# Patient Record
Sex: Male | Born: 1940 | Race: White | Hispanic: No | Marital: Married | Smoking: Never smoker
Health system: Southern US, Community
[De-identification: ages and names within clinical notes are randomized; demographics above are authoritative.]

## PROBLEM LIST (undated history)

## (undated) DIAGNOSIS — K219 Gastro-esophageal reflux disease without esophagitis: Secondary | ICD-10-CM

## (undated) DIAGNOSIS — N401 Enlarged prostate with lower urinary tract symptoms: Secondary | ICD-10-CM

## (undated) DIAGNOSIS — Z860100 Personal history of colon polyps, unspecified: Secondary | ICD-10-CM

## (undated) DIAGNOSIS — F32A Depression, unspecified: Secondary | ICD-10-CM

## (undated) DIAGNOSIS — I639 Cerebral infarction, unspecified: Secondary | ICD-10-CM

## (undated) DIAGNOSIS — M1990 Unspecified osteoarthritis, unspecified site: Secondary | ICD-10-CM

## (undated) DIAGNOSIS — T7840XA Allergy, unspecified, initial encounter: Secondary | ICD-10-CM

## (undated) DIAGNOSIS — F329 Major depressive disorder, single episode, unspecified: Secondary | ICD-10-CM

## (undated) DIAGNOSIS — E785 Hyperlipidemia, unspecified: Secondary | ICD-10-CM

## (undated) DIAGNOSIS — H579 Unspecified disorder of eye and adnexa: Secondary | ICD-10-CM

## (undated) DIAGNOSIS — G629 Polyneuropathy, unspecified: Secondary | ICD-10-CM

## (undated) DIAGNOSIS — N138 Other obstructive and reflux uropathy: Secondary | ICD-10-CM

## (undated) DIAGNOSIS — I219 Acute myocardial infarction, unspecified: Secondary | ICD-10-CM

## (undated) DIAGNOSIS — I10 Essential (primary) hypertension: Secondary | ICD-10-CM

## (undated) DIAGNOSIS — Z8719 Personal history of other diseases of the digestive system: Secondary | ICD-10-CM

## (undated) HISTORY — DX: Allergy, unspecified, initial encounter: T78.40XA

## (undated) HISTORY — PX: ESOPHAGOGASTRODUODENOSCOPY: SHX1529

## (undated) HISTORY — PX: CARDIAC CATHETERIZATION: SHX172

## (undated) HISTORY — DX: Other obstructive and reflux uropathy: N13.8

## (undated) HISTORY — PX: CORONARY ARTERY BYPASS GRAFT: SHX141

## (undated) HISTORY — PX: SPINE SURGERY: SHX786

## (undated) HISTORY — PX: COLONOSCOPY: SHX174

## (undated) HISTORY — DX: Hyperlipidemia, unspecified: E78.5

## (undated) HISTORY — DX: Depression, unspecified: F32.A

## (undated) HISTORY — DX: Unspecified osteoarthritis, unspecified site: M19.90

## (undated) HISTORY — PX: TONSILLECTOMY: SUR1361

## (undated) HISTORY — PX: OTHER SURGICAL HISTORY: SHX169

## (undated) HISTORY — DX: Personal history of colonic polyps: Z86.010

## (undated) HISTORY — DX: Benign prostatic hyperplasia with lower urinary tract symptoms: N40.1

## (undated) HISTORY — DX: Personal history of colon polyps, unspecified: Z86.0100

## (undated) HISTORY — DX: Major depressive disorder, single episode, unspecified: F32.9

## (undated) HISTORY — PX: ENUCLEATION: SHX628

## (undated) HISTORY — DX: Gastro-esophageal reflux disease without esophagitis: K21.9

---
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
## 2000-04-18 LAB — HM COLONOSCOPY

## 2010-11-16 ENCOUNTER — Ambulatory Visit: Payer: Self-pay | Admitting: Endocrinology

## 2010-12-13 ENCOUNTER — Encounter: Payer: Self-pay | Admitting: Endocrinology

## 2010-12-13 ENCOUNTER — Ambulatory Visit (INDEPENDENT_AMBULATORY_CARE_PROVIDER_SITE_OTHER): Payer: Medicare Other | Admitting: Endocrinology

## 2010-12-13 DIAGNOSIS — M109 Gout, unspecified: Secondary | ICD-10-CM

## 2010-12-13 DIAGNOSIS — E109 Type 1 diabetes mellitus without complications: Secondary | ICD-10-CM

## 2010-12-13 DIAGNOSIS — E119 Type 2 diabetes mellitus without complications: Secondary | ICD-10-CM

## 2010-12-13 DIAGNOSIS — Z794 Long term (current) use of insulin: Secondary | ICD-10-CM

## 2010-12-13 NOTE — Progress Notes (Signed)
Subjective:    Patient ID: Alexander Stafford, male    DOB: Jan 04, 1941, 70 y.o.   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  he has been on insulin x 8 years.  he takes lantus, prn novolog, and glyburide.  Pt says cbg's are 300's due to being on prednisone for gout, x 2 weeks.  He says sine he has been on the prednisone, he has been requring approx 100 units total of insulin per day.  He says he expects to be off his prednisone in approx 3 days. 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  He says exercise has recently been limited by gout pain.   He had to stop metformin, due to renal insufficiency. He hs 10 years of moderate numbness of the feet, and intermittent assoc pain.   Past Medical History  Diagnosis Date  . Arthritis   . Depression   . Diabetes mellitus   . GERD (gastroesophageal reflux disease)   . Allergy   . BPH (benign prostatic hypertrophy) with urinary obstruction   . History of colon polyps   . Hyperlipidemia   . Gout     Past Surgical History  Procedure Date  . Tonsillectomy 1970's  . Enucleation     right eye removed-occular occulsion  . Thumb surgery 1959    History   Social History  . Marital Status: Married    Spouse Name: N/A    Number of Children: N/A  . Years of Education: N/A   Occupational History  . Minister     Social History Main Topics  . Smoking status: Never Smoker   . Smokeless tobacco: Not on file  . Alcohol Use: Yes     on occasion-wine  . Drug Use: No  . Sexually Active: Not on file   Other Topics Concern  . Not on file   Social History Narrative   Retired    No current outpatient prescriptions on file prior to visit.    No Known Allergies  Family History  Problem Relation Age of Onset  . Arthritis Other     Parent  . Cancer Other     Colon Cancer-Parent  . Heart disease Other       Parent  . Kidney disease Other     parent  . Diabetes Other     Parent    BP 140/86  Pulse 77  Temp(Src) 98.3 F (36.8 C) (Oral)  Ht 5\' 10"  (1.778 m)  Wt 248 lb 9.6 oz (112.764 kg)  BMI 35.67 kg/m2  SpO2 95%  Review of Systems denies blurry vision, headache, chest pain, sob, n/v, urinary frequency, cramps, excessive diaphoresis, memory loss, depression, hypoglycemia, and rhinorrhea.   She has weight gain in the context of taking prednisone.  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   He has easy bruising    Objective:   Physical Exam VS: see vs page GEN: no distress.  obese HEAD: head: no deformity eyes: no periorbital swelling, no proptosis external nose and ears are normal mouth: no lesion seen NECK: supple, thyroid is not enlarged CHEST WALL: no deformity LUNGS: clear to auscultation CV: reg rate and rhythm, no murmur ABD: abdomen is soft, nontender.  no hepatosplenomegaly.  not distended.  no hernia MUSCULOSKELETAL: muscle bulk and strength are grossly normal.  no  obvious joint swelling.  gait is normal and steady EXTEMITIES: no deformity.  no ulcer on the feet.  feet are of normal color and temp.  Trace bilat leg edema.  There is onychomycosis of right great toenail PULSES: dorsalis pedis intact bilat.  no carotid bruit NEURO:  cn 2-12 grossly intact.   readily moves all 4's.  sensation is intact to touch on the feet, but decreased from normal. SKIN:  Normal texture and temperature.  No rash or suspicious lesion is visible.   NODES:  None palpable at the neck PSYCH: alert, oriented x3.  Does not appear anxious nor depressed.     Assessment & Plan:  Dm, with several chronic complications.  he is due for some adjustment in his therapy Gout.  Prednisone exacerbates hyperglycemia. Renal insuff.  This precludes metformin rx.

## 2010-12-13 NOTE — Patient Instructions (Addendum)
good diet and exercise habits significanly improve the control of your diabetes.  please let me know if you wish to be referred to a dietician.  high blood sugar is very risky to your health.  you should see an eye doctor every year. controlling your blood pressure and cholesterol drastically reduces the damage diabetes does to your body.  this also applies to quitting smoking.  please discuss these with your doctor.  you should take an aspirin every day, unless you have been advised by a doctor not to. check your blood sugar 2 times a day.  vary the time of day when you check, between before the 3 meals, and at bedtime.  also check if you have symptoms of your blood sugar being too high or too low.  please keep a record of the readings and bring it to your next appointment here.  please call us sooner if you are having low blood sugar episodes. we will need to take this complex situation in stages. For now, stop glyburide. Increase lantus to 70 units daily. Continue novolog as needed.   Please make a follow-up appointment in 2 weeks.

## 2010-12-14 ENCOUNTER — Encounter: Payer: Self-pay | Admitting: Endocrinology

## 2010-12-14 DIAGNOSIS — F329 Major depressive disorder, single episode, unspecified: Secondary | ICD-10-CM | POA: Insufficient documentation

## 2010-12-14 DIAGNOSIS — M1990 Unspecified osteoarthritis, unspecified site: Secondary | ICD-10-CM | POA: Insufficient documentation

## 2010-12-14 DIAGNOSIS — F32A Depression, unspecified: Secondary | ICD-10-CM | POA: Insufficient documentation

## 2010-12-14 DIAGNOSIS — E785 Hyperlipidemia, unspecified: Secondary | ICD-10-CM | POA: Insufficient documentation

## 2010-12-14 DIAGNOSIS — E109 Type 1 diabetes mellitus without complications: Secondary | ICD-10-CM | POA: Insufficient documentation

## 2010-12-14 DIAGNOSIS — E119 Type 2 diabetes mellitus without complications: Secondary | ICD-10-CM | POA: Insufficient documentation

## 2010-12-14 DIAGNOSIS — T7840XA Allergy, unspecified, initial encounter: Secondary | ICD-10-CM | POA: Insufficient documentation

## 2010-12-14 DIAGNOSIS — M109 Gout, unspecified: Secondary | ICD-10-CM | POA: Insufficient documentation

## 2010-12-14 DIAGNOSIS — K219 Gastro-esophageal reflux disease without esophagitis: Secondary | ICD-10-CM | POA: Insufficient documentation

## 2011-01-04 ENCOUNTER — Ambulatory Visit (INDEPENDENT_AMBULATORY_CARE_PROVIDER_SITE_OTHER): Payer: Medicare Other | Admitting: Endocrinology

## 2011-01-04 ENCOUNTER — Encounter: Payer: Self-pay | Admitting: Endocrinology

## 2011-01-04 VITALS — BP 132/88 | HR 66 | Temp 98.1°F | Ht 70.0 in | Wt 248.0 lb

## 2011-01-04 DIAGNOSIS — E119 Type 2 diabetes mellitus without complications: Secondary | ICD-10-CM

## 2011-01-04 NOTE — Progress Notes (Signed)
  Subjective:    Patient ID: Alexander Stafford, male    DOB: 11/07/1940, 70 y.o.   MRN: [id number]  HPI pt states he feels well in general.  He says cbg's are much better, off the prednisone.  no cbg record, but states cbg's vary from 150-300.  It is in general higher as the day goes on.   Past Medical History  Diagnosis Date  . BPH (benign prostatic hypertrophy) with urinary obstruction   . History of colon polyps   . Gout   . Allergy   . Arthritis   . Depression   . Diabetes mellitus   . GERD (gastroesophageal reflux disease)   . Hyperlipidemia     Past Surgical History  Procedure Date  . Tonsillectomy 1970's  . Enucleation     right eye removed-occular occulsion  . Thumb surgery 1959    History   Social History  . Marital Status: Married    Spouse Name: N/A    Number of Children: N/A  . Years of Education: N/A   Occupational History  . Minister     Social History Main Topics  . Smoking status: Never Smoker   . Smokeless tobacco: Not on file  . Alcohol Use: Yes     on occasion-wine  . Drug Use: No  . Sexually Active: Not on file   Other Topics Concern  . Not on file   Social History Narrative   Retired    Current Outpatient Prescriptions on File Prior to Visit  Medication Sig Dispense Refill  . allopurinol (ZYLOPRIM) 300 MG tablet Take 300 mg by mouth daily.        Marland Kitchen aspirin 81 MG tablet Take 81 mg by mouth daily.        . colchicine 0.6 MG tablet Take 0.6 mg by mouth as needed.        . doxazosin (CARDURA) 2 MG tablet Take 2 mg by mouth at bedtime.        . indomethacin (INDOCIN) 50 MG capsule Take 50 mg by mouth as needed.        . insulin aspart (NOVOLOG) 100 UNIT/ML injection On sliding scale       . insulin glargine (LANTUS) 100 UNIT/ML injection Inject 70 Units into the skin at bedtime.       . predniSONE (DELTASONE) 20 MG tablet Take 20 mg by mouth as directed.          No Known Allergies  Family History  Problem Relation Age of Onset  . Arthritis  Other     Parent  . Cancer Other     Colon Cancer-Parent  . Heart disease Other     Parent  . Kidney disease Other     parent  . Diabetes Other     Parent   There were no vitals taken for this visit.  Review of Systems denies hypoglycemia    Objective:   Physical Exam VITAL SIGNS:  See vs page GENERAL: no distress PSYCH: Alert and oriented x 3.  Does not appear anxious nor depressed.    Assessment & Plan:  Dm, needs increased rx

## 2011-01-04 NOTE — Patient Instructions (Signed)
Reduce lantus to 50 units at bedtime Take scheduled novolog, 15 units 3x a day (just before each meal) Please come back for a follow-up appointment for 1 month.  Please make an appointment. This ov was conducted during a computer outage, so pt did not receive an avs.

## 2011-02-07 ENCOUNTER — Ambulatory Visit: Payer: Medicare Other | Admitting: Endocrinology

## 2013-04-18 DIAGNOSIS — I219 Acute myocardial infarction, unspecified: Secondary | ICD-10-CM

## 2013-04-18 HISTORY — DX: Acute myocardial infarction, unspecified: I21.9

## 2016-03-24 ENCOUNTER — Ambulatory Visit (INDEPENDENT_AMBULATORY_CARE_PROVIDER_SITE_OTHER): Payer: Medicare Other | Admitting: Otolaryngology

## 2016-03-24 DIAGNOSIS — J342 Deviated nasal septum: Secondary | ICD-10-CM

## 2016-03-24 DIAGNOSIS — H6982 Other specified disorders of Eustachian tube, left ear: Secondary | ICD-10-CM

## 2016-03-24 DIAGNOSIS — H6123 Impacted cerumen, bilateral: Secondary | ICD-10-CM | POA: Diagnosis not present

## 2016-03-24 DIAGNOSIS — J343 Hypertrophy of nasal turbinates: Secondary | ICD-10-CM | POA: Diagnosis not present

## 2016-03-24 DIAGNOSIS — J310 Chronic rhinitis: Secondary | ICD-10-CM

## 2016-03-24 DIAGNOSIS — H903 Sensorineural hearing loss, bilateral: Secondary | ICD-10-CM

## 2016-03-25 ENCOUNTER — Other Ambulatory Visit (INDEPENDENT_AMBULATORY_CARE_PROVIDER_SITE_OTHER): Payer: Self-pay | Admitting: Otolaryngology

## 2016-03-25 DIAGNOSIS — J328 Other chronic sinusitis: Secondary | ICD-10-CM

## 2016-04-01 ENCOUNTER — Ambulatory Visit (HOSPITAL_COMMUNITY): Payer: Medicare Other

## 2016-04-12 ENCOUNTER — Ambulatory Visit (HOSPITAL_COMMUNITY)
Admission: RE | Admit: 2016-04-12 | Discharge: 2016-04-12 | Disposition: A | Discharge status: Home / Self Care | Payer: Medicare Other | Source: Ambulatory Visit | Attending: Otolaryngology | Admitting: Otolaryngology

## 2016-04-12 DIAGNOSIS — J328 Other chronic sinusitis: Secondary | ICD-10-CM

## 2016-04-28 ENCOUNTER — Ambulatory Visit (INDEPENDENT_AMBULATORY_CARE_PROVIDER_SITE_OTHER): Payer: Medicare Other | Admitting: Otolaryngology

## 2016-04-28 DIAGNOSIS — J342 Deviated nasal septum: Secondary | ICD-10-CM

## 2016-04-28 DIAGNOSIS — J343 Hypertrophy of nasal turbinates: Secondary | ICD-10-CM | POA: Diagnosis not present

## 2016-04-28 DIAGNOSIS — J310 Chronic rhinitis: Secondary | ICD-10-CM | POA: Diagnosis not present

## 2016-05-02 ENCOUNTER — Other Ambulatory Visit: Payer: Self-pay | Admitting: Otolaryngology

## 2016-05-24 ENCOUNTER — Ambulatory Visit (HOSPITAL_BASED_OUTPATIENT_CLINIC_OR_DEPARTMENT_OTHER): Admit: 2016-05-24 | Payer: Medicare Other | Admitting: Otolaryngology

## 2016-05-24 ENCOUNTER — Encounter (HOSPITAL_BASED_OUTPATIENT_CLINIC_OR_DEPARTMENT_OTHER): Payer: Self-pay

## 2016-05-24 SURGERY — SEPTOPLASTY, NOSE, WITH NASAL TURBINATE REDUCTION
Anesthesia: General | Laterality: Bilateral

## 2016-07-11 ENCOUNTER — Ambulatory Visit (INDEPENDENT_AMBULATORY_CARE_PROVIDER_SITE_OTHER): Payer: Medicare Other | Admitting: Otolaryngology

## 2016-07-11 DIAGNOSIS — J342 Deviated nasal septum: Secondary | ICD-10-CM | POA: Diagnosis not present

## 2016-07-11 DIAGNOSIS — J310 Chronic rhinitis: Secondary | ICD-10-CM | POA: Diagnosis not present

## 2016-07-11 DIAGNOSIS — J343 Hypertrophy of nasal turbinates: Secondary | ICD-10-CM

## 2016-08-05 ENCOUNTER — Other Ambulatory Visit: Payer: Self-pay | Admitting: Otolaryngology

## 2016-10-25 ENCOUNTER — Encounter (HOSPITAL_COMMUNITY): Payer: Self-pay

## 2016-10-25 ENCOUNTER — Encounter (HOSPITAL_COMMUNITY)
Admission: RE | Admit: 2016-10-25 | Discharge: 2016-10-25 | Disposition: A | Discharge status: Home / Self Care | Payer: Medicare Other | Source: Ambulatory Visit | Attending: Otolaryngology | Admitting: Otolaryngology

## 2016-10-25 DIAGNOSIS — E785 Hyperlipidemia, unspecified: Secondary | ICD-10-CM | POA: Diagnosis not present

## 2016-10-25 DIAGNOSIS — K219 Gastro-esophageal reflux disease without esophagitis: Secondary | ICD-10-CM | POA: Diagnosis not present

## 2016-10-25 DIAGNOSIS — H5461 Unqualified visual loss, right eye, normal vision left eye: Secondary | ICD-10-CM | POA: Diagnosis not present

## 2016-10-25 DIAGNOSIS — Z79899 Other long term (current) drug therapy: Secondary | ICD-10-CM | POA: Diagnosis not present

## 2016-10-25 DIAGNOSIS — Z01812 Encounter for preprocedural laboratory examination: Secondary | ICD-10-CM | POA: Diagnosis not present

## 2016-10-25 DIAGNOSIS — I10 Essential (primary) hypertension: Secondary | ICD-10-CM | POA: Insufficient documentation

## 2016-10-25 DIAGNOSIS — E119 Type 2 diabetes mellitus without complications: Secondary | ICD-10-CM | POA: Diagnosis not present

## 2016-10-25 DIAGNOSIS — Z7982 Long term (current) use of aspirin: Secondary | ICD-10-CM | POA: Diagnosis not present

## 2016-10-25 DIAGNOSIS — Z794 Long term (current) use of insulin: Secondary | ICD-10-CM | POA: Diagnosis not present

## 2016-10-25 DIAGNOSIS — Z8673 Personal history of transient ischemic attack (TIA), and cerebral infarction without residual deficits: Secondary | ICD-10-CM | POA: Diagnosis not present

## 2016-10-25 DIAGNOSIS — I2510 Atherosclerotic heart disease of native coronary artery without angina pectoris: Secondary | ICD-10-CM | POA: Insufficient documentation

## 2016-10-25 HISTORY — DX: Polyneuropathy, unspecified: G62.9

## 2016-10-25 HISTORY — DX: Personal history of other diseases of the digestive system: Z87.19

## 2016-10-25 HISTORY — DX: Unspecified disorder of eye and adnexa: H57.9

## 2016-10-25 HISTORY — DX: Cerebral infarction, unspecified: I63.9

## 2016-10-25 HISTORY — DX: Acute myocardial infarction, unspecified: I21.9

## 2016-10-25 HISTORY — DX: Essential (primary) hypertension: I10

## 2016-10-25 LAB — COMPREHENSIVE METABOLIC PANEL
ALBUMIN: 3.7 g/dL (ref 3.5–5.0)
ALT: 17 U/L (ref 17–63)
AST: 25 U/L (ref 15–41)
Alkaline Phosphatase: 68 U/L (ref 38–126)
Anion gap: 9 (ref 5–15)
BUN: 23 mg/dL — ABNORMAL HIGH (ref 6–20)
CHLORIDE: 105 mmol/L (ref 101–111)
CO2: 22 mmol/L (ref 22–32)
Calcium: 9 mg/dL (ref 8.9–10.3)
Creatinine, Ser: 1.41 mg/dL — ABNORMAL HIGH (ref 0.61–1.24)
GFR calc Af Amer: 55 mL/min — ABNORMAL LOW (ref >60)
GFR calc non Af Amer: 47 mL/min — ABNORMAL LOW (ref >60)
GLUCOSE: 232 mg/dL — AB (ref 65–99)
POTASSIUM: 4 mmol/L (ref 3.5–5.1)
SODIUM: 136 mmol/L (ref 135–145)
Total Bilirubin: 0.7 mg/dL (ref 0.3–1.2)
Total Protein: 6.2 g/dL — ABNORMAL LOW (ref 6.5–8.1)

## 2016-10-25 LAB — CBC
HEMATOCRIT: 42.4 % (ref 39.0–52.0)
Hemoglobin: 14.1 g/dL (ref 13.0–17.0)
MCH: 30.4 pg (ref 26.0–34.0)
MCHC: 33.3 g/dL (ref 30.0–36.0)
MCV: 91.4 fL (ref 78.0–100.0)
Platelets: 207 10*3/uL (ref 150–400)
RBC: 4.64 MIL/uL (ref 4.22–5.81)
RDW: 14 % (ref 11.5–15.5)
WBC: 6.4 10*3/uL (ref 4.0–10.5)

## 2016-10-25 LAB — GLUCOSE, CAPILLARY: GLUCOSE-CAPILLARY: 242 mg/dL — AB (ref 65–99)

## 2016-10-25 LAB — NO BLOOD PRODUCTS

## 2016-10-25 NOTE — Pre-Procedure Instructions (Signed)
Alexander Stafford  10/25/2016      [address], [address] [place] [address] Phone: [phone] Fax: [place], [place] [address] [place] [address] Phone: [phone] Fax: [phone]    Your procedure is scheduled on July 18  Report to [facility] at [place].M.  Call this number if you have problems the morning of surgery:  [phone]   Remember:  Do not eat food or drink liquids after midnight.   Take these medicines the morning of surgery with A SIP OF WATER allopurinol (ZYLOPRIM), carvedilol (COREG), colchicine,  indomethacin (INDOCIN),   7 days prior to surgery STOP taking any meloxicam (MOBIC), Aspirin, Aleve, Naproxen, Ibuprofen, Motrin, Advil, Goody's, BC's, all herbal medications, fish oil, and all vitamins  WHAT DO I DO ABOUT MY DIABETES MEDICATION?   Marland Kitchen Do not take oral diabetes medicines (pills) the morning of surgery.   The day before surgery take insulin lispro as usual        The Night before surgery, take 25 units of TRESIBA insulin  . HE MORNING OF SURGERY, take ______2_______ units of ___insulin lispro_______insulin if needed for a blood sugar over 150.  Marland Kitchen The day of surgery, do not take other diabetes injectables, including Byetta (exenatide), Bydureon (exenatide ER), Victoza (liraglutide), or Trulicity (dulaglutide).  . If your CBG is greater than 220 mg/dL, you may take  of your sliding scale (correction) dose of insulin.   How to Manage Your Diabetes Before and After Surgery  Why is it important to control my blood sugar before and after surgery? . Improving blood sugar levels before and after surgery helps healing and can limit problems. . A way of improving blood sugar control is eating a healthy diet by: o  Eating less sugar and carbohydrates o  Increasing activity/exercise o   Talking with your doctor about reaching your blood sugar goals . High blood sugars (greater than 180 mg/dL) can raise your risk of infections and slow your recovery, so you will need to focus on controlling your diabetes during the weeks before surgery. . Make sure that the doctor who takes care of your diabetes knows about your planned surgery including the date and location.  How do I manage my blood sugar before surgery? . Check your blood sugar at least 4 times a day, starting 2 days before surgery, to make sure that the level is not too high or low. o Check your blood sugar the morning of your surgery when you wake up and every 2 hours until you get to the Short Stay unit. . If your blood sugar is less than 70 mg/dL, you will need to treat for low blood sugar: o Do not take insulin. o Treat a low blood sugar (less than 70 mg/dL) with  cup of clear juice (cranberry or apple), 4 glucose tablets, OR glucose gel. o Recheck blood sugar in 15 minutes after treatment (to make sure it is greater than 70 mg/dL). If your blood sugar is not greater than 70 mg/dL on recheck, call [phone] for further instructions. . Report your blood sugar to the short stay nurse when you get to Short Stay.  . If you are admitted to the hospital after surgery: o Your blood sugar will be checked by the staff and you will probably be given insulin  after surgery (instead of oral diabetes medicines) to make sure you have good blood sugar levels. o The goal for blood sugar control after surgery is 80-180 mg/dL.    Do not wear jewelry  Do not wear lotions, powders, or cologne, or deoderant.  Men may shave face and neck.  Do not bring valuables to the hospital.  [facility] is not responsible for any belongings or valuables.  Contacts, dentures or bridgework may not be worn into surgery.  Leave your suitcase in the car.  After surgery it may be brought to your room.  For patients admitted to the hospital, discharge  time will be determined by your treatment team.  Patients discharged the day of surgery will not be allowed to drive home.    Special instructions:   [facility]- Preparing For Surgery  Before surgery, you can play an important role. Because skin is not sterile, your skin needs to be as free of germs as possible. You can reduce the number of germs on your skin by washing with CHG (chlorahexidine gluconate) Soap before surgery.  CHG is an antiseptic cleaner which kills germs and bonds with the skin to continue killing germs even after washing.  Please do not use if you have an allergy to CHG or antibacterial soaps. If your skin becomes reddened/irritated stop using the CHG.  Do not shave (including legs and underarms) for at least 48 hours prior to first CHG shower. It is OK to shave your face.  Please follow these instructions carefully.   1. Shower the NIGHT BEFORE SURGERY and the MORNING OF SURGERY with CHG.   2. If you chose to wash your hair, wash your hair first as usual with your normal shampoo.  3. After you shampoo, rinse your hair and body thoroughly to remove the shampoo.  4. Use CHG as you would any other liquid soap. You can apply CHG directly to the skin and wash gently with a scrungie or a clean washcloth.   5. Apply the CHG Soap to your body ONLY FROM THE NECK DOWN.  Do not use on open wounds or open sores. Avoid contact with your eyes, ears, mouth and genitals (private parts). Wash genitals (private parts) with your normal soap.  6. Wash thoroughly, paying special attention to the area where your surgery will be performed.  7. Thoroughly rinse your body with warm water from the neck down.  8. DO NOT shower/wash with your normal soap after using and rinsing off the CHG Soap.  9. Pat yourself dry with a CLEAN TOWEL.   10. Wear CLEAN PAJAMAS   11. Place CLEAN SHEETS on your bed the night of your first shower and DO NOT SLEEP WITH PETS.    Day of Surgery: Do not  apply any deodorants/lotions. Please wear clean clothes to the hospital/surgery center.      Please read over the following fact sheets that you were given.

## 2016-10-25 NOTE — Progress Notes (Signed)
PCP - Vladimir Creeks Cardiologist - Dorie Rank  Chest x-ray - not needed EKG - 06/15/16 requesting from cardiologist Stress Test - > 10 years ECHO - 2015   Cardiac Cath - 2015   Fasting Blood Sugar - 70-120s Checks Blood Sugar __3___ times a day   Will send to anesthesia for review of cardiac records, and records requested  Patient denies shortness of breath, fever, cough and chest pain at PAT appointment   Patient verbalized understanding of instructions that were given to them at the PAT appointment. Patient was also instructed that they will need to review over the PAT instructions again at home before surgery.

## 2016-10-26 NOTE — Progress Notes (Addendum)
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 BMI 35.  Medications include: ASA 81 mg, carvedilol, Humalog, lisinopril, tresiba  BP (!) 179/92   Pulse (!) 54   Temp 36.5 C (Oral)   Resp 20   Ht 5\' 10"  (1.778 m)   Wt 243 lb 6.2 oz (110.4 kg)   SpO2 99%   BMI 34.92 kg/m    Preoperative labs reviewed.  Glucose 232. HbA1c 6.4 on 09/09/16 (care everywhere)  EKG 06/07/16 ([facility]): Sinus bradycardia (56 bpm). RBBB.  Carotid duplex 10/05/15 (care everywhere): 1.No hemodynamically significant stenosis on either side. 2.Both vertebral arteries are patent with antegrade flow.  Stress test 02/09/15 ([facility]):  1. No scintigraphic evidence of inducible myocardial ischemia. Small to moderate sized fixed defect in the lateral wall consistent with prior MI. 2. Post-stress EF 57%. Global LV systolic function is normal. 3. Exercise capacity 7 METS. No ECG changes   Echo 11/20/13 (care everywhere):  - A portable transthoracic echocardiogram with 2-D, color flow, and Doppler was performed. The study was technically difficult. There is no comparison study available. - LV normal in size. Mild concentric LVH.  LV ejection fraction is normal (55-60%). There is inferolateral wall hypokinesis. - The mitral valve leaflets appear calcified, but not stenotic. - Moderate aortic sclerosis is present with good valvular opening. - Right ventricular systolic pressure is normal. - There is mild (1+) mitral regurgitation. - Grade II moderate diastolic dysfunction; pseudonormal mitral inflow pattern.  No cardiac cath since  prior to CABG 11/20/13 (notes in care everywhere)   If no changes, I anticipate pt can proceed with surgery as scheduled.   Willeen Cass, FNP-BC [facility]/Anesthesiology Phone: [phone] 10/28/2016 3:37 PM

## 2016-11-01 NOTE — Anesthesia Preprocedure Evaluation (Addendum)
Anesthesia Evaluation  Patient identified by MRN, date of birth, ID band Patient awake    Reviewed: Allergy & Precautions, H&P , NPO status , Patient's Chart, lab work & pertinent test results  Airway Mallampati: III  TM Distance: >3 FB Neck ROM: Full  Mouth opening: Limited Mouth Opening  Dental no notable dental hx. (+) Teeth Intact, Dental Advisory Given   Pulmonary neg pulmonary ROS,    Pulmonary exam normal breath sounds clear to auscultation       Cardiovascular Exercise Tolerance: Good hypertension, Pt. on medications and Pt. on home beta blockers + Past MI   Rhythm:Regular Rate:Normal     Neuro/Psych Depression CVA negative psych ROS   GI/Hepatic Neg liver ROS, hiatal hernia, GERD  Medicated and Controlled,  Endo/Other  diabetes, Insulin Dependent  Renal/GU negative Renal ROS  negative genitourinary   Musculoskeletal  (+) Arthritis , Osteoarthritis,    Abdominal   Peds  Hematology negative hematology ROS (+)   Anesthesia Other Findings   Reproductive/Obstetrics negative OB ROS                            Anesthesia Physical Anesthesia Plan  ASA: III  Anesthesia Plan: General   Post-op Pain Management:    Induction: Intravenous  PONV Risk Score and Plan: 3 and Ondansetron, Dexamethasone and Midazolam  Airway Management Planned: Oral ETT and Video Laryngoscope Planned  Additional Equipment:   Intra-op Plan:   Post-operative Plan: Extubation in OR  Informed Consent: I have reviewed the patients History and Physical, chart, labs and discussed the procedure including the risks, benefits and alternatives for the proposed anesthesia with the patient or authorized representative who has indicated his/her understanding and acceptance.   Dental advisory given  Plan Discussed with: CRNA  Anesthesia Plan Comments:        Anesthesia Quick Evaluation

## 2016-11-02 ENCOUNTER — Encounter (HOSPITAL_COMMUNITY): Payer: Self-pay

## 2016-11-02 ENCOUNTER — Ambulatory Visit (HOSPITAL_COMMUNITY)
Admission: RE | Admit: 2016-11-02 | Discharge: 2016-11-02 | Disposition: A | Discharge status: Home / Self Care | Payer: Medicare Other | Source: Ambulatory Visit | Attending: Otolaryngology | Admitting: Otolaryngology

## 2016-11-02 ENCOUNTER — Ambulatory Visit (HOSPITAL_COMMUNITY): Payer: Medicare Other | Admitting: Certified Registered Nurse Anesthetist

## 2016-11-02 ENCOUNTER — Encounter (HOSPITAL_COMMUNITY)
Admission: RE | Disposition: A | Discharge status: Home / Self Care | Payer: Self-pay | Source: Ambulatory Visit | Attending: Otolaryngology

## 2016-11-02 ENCOUNTER — Ambulatory Visit (HOSPITAL_COMMUNITY): Payer: Medicare Other | Admitting: Emergency Medicine

## 2016-11-02 DIAGNOSIS — Z951 Presence of aortocoronary bypass graft: Secondary | ICD-10-CM | POA: Diagnosis not present

## 2016-11-02 DIAGNOSIS — Z794 Long term (current) use of insulin: Secondary | ICD-10-CM | POA: Insufficient documentation

## 2016-11-02 DIAGNOSIS — J3489 Other specified disorders of nose and nasal sinuses: Secondary | ICD-10-CM | POA: Diagnosis not present

## 2016-11-02 DIAGNOSIS — I10 Essential (primary) hypertension: Secondary | ICD-10-CM | POA: Diagnosis not present

## 2016-11-02 DIAGNOSIS — K219 Gastro-esophageal reflux disease without esophagitis: Secondary | ICD-10-CM | POA: Diagnosis not present

## 2016-11-02 DIAGNOSIS — E119 Type 2 diabetes mellitus without complications: Secondary | ICD-10-CM | POA: Diagnosis not present

## 2016-11-02 DIAGNOSIS — J343 Hypertrophy of nasal turbinates: Secondary | ICD-10-CM | POA: Insufficient documentation

## 2016-11-02 DIAGNOSIS — I252 Old myocardial infarction: Secondary | ICD-10-CM | POA: Diagnosis not present

## 2016-11-02 DIAGNOSIS — D3705 Neoplasm of uncertain behavior of pharynx: Secondary | ICD-10-CM | POA: Diagnosis not present

## 2016-11-02 DIAGNOSIS — Z8673 Personal history of transient ischemic attack (TIA), and cerebral infarction without residual deficits: Secondary | ICD-10-CM | POA: Insufficient documentation

## 2016-11-02 DIAGNOSIS — J342 Deviated nasal septum: Secondary | ICD-10-CM | POA: Insufficient documentation

## 2016-11-02 DIAGNOSIS — J339 Nasal polyp, unspecified: Secondary | ICD-10-CM | POA: Diagnosis not present

## 2016-11-02 HISTORY — PX: NASOPHARYNGEAL BIOPSY: SHX6488

## 2016-11-02 HISTORY — PX: NASAL SEPTOPLASTY W/ TURBINOPLASTY: SHX2070

## 2016-11-02 LAB — GLUCOSE, CAPILLARY
GLUCOSE-CAPILLARY: 66 mg/dL (ref 65–99)
Glucose-Capillary: 95 mg/dL (ref 65–99)
Glucose-Capillary: 135 mg/dL — ABNORMAL HIGH (ref 65–99)

## 2016-11-02 SURGERY — SEPTOPLASTY, NOSE, WITH NASAL TURBINATE REDUCTION
Anesthesia: General | Site: Nose

## 2016-11-02 MED ORDER — FENTANYL CITRATE (PF) 250 MCG/5ML IJ SOLN
INTRAMUSCULAR | Status: AC
  Filled 2016-11-02: qty 5

## 2016-11-02 MED ORDER — BACITRACIN ZINC 500 UNIT/GM EX OINT
TOPICAL_OINTMENT | CUTANEOUS | Status: AC
  Filled 2016-11-02: qty 28.35

## 2016-11-02 MED ORDER — HYDRALAZINE HCL 20 MG/ML IJ SOLN
5.0000 mg | Freq: Once | INTRAMUSCULAR | Status: AC
  Administered 2016-11-02: 5 mg via INTRAVENOUS

## 2016-11-02 MED ORDER — PHENYLEPHRINE HCL 10 MG/ML IJ SOLN
INTRAVENOUS | Status: DC | PRN
  Administered 2016-11-02: 25 ug/min via INTRAVENOUS

## 2016-11-02 MED ORDER — OXYCODONE-ACETAMINOPHEN 5-325 MG PO TABS
1.0000 | ORAL_TABLET | ORAL | 0 refills | Status: AC | PRN
Start: 2016-11-02 — End: ?

## 2016-11-02 MED ORDER — FENTANYL CITRATE (PF) 100 MCG/2ML IJ SOLN
INTRAMUSCULAR | Status: DC | PRN
  Administered 2016-11-02: 50 ug via INTRAVENOUS
  Administered 2016-11-02 (×2): 25 ug via INTRAVENOUS
  Administered 2016-11-02: 50 ug via INTRAVENOUS

## 2016-11-02 MED ORDER — SUCCINYLCHOLINE CHLORIDE 200 MG/10ML IV SOSY
PREFILLED_SYRINGE | INTRAVENOUS | Status: AC
  Filled 2016-11-02: qty 20

## 2016-11-02 MED ORDER — CARVEDILOL 12.5 MG PO TABS
ORAL_TABLET | ORAL | Status: AC
  Filled 2016-11-02: qty 1

## 2016-11-02 MED ORDER — MIDAZOLAM HCL 5 MG/5ML IJ SOLN: INTRAMUSCULAR | Status: DC | PRN

## 2016-11-02 MED ORDER — HYDRALAZINE HCL 20 MG/ML IJ SOLN
INTRAMUSCULAR | Status: AC
  Filled 2016-11-02: qty 1

## 2016-11-02 MED ORDER — PROPOFOL 10 MG/ML IV BOLUS
INTRAVENOUS | Status: AC
Start: 2016-11-02 — End: 2016-11-02
  Filled 2016-11-02: qty 20

## 2016-11-02 MED ORDER — CEFAZOLIN SODIUM 1 G IJ SOLR
INTRAMUSCULAR | Status: AC
  Filled 2016-11-02: qty 20

## 2016-11-02 MED ORDER — CARVEDILOL 12.5 MG PO TABS
12.5000 mg | ORAL_TABLET | Freq: Two times a day (BID) | ORAL | Status: DC
  Administered 2016-11-02: 12.5 mg via ORAL

## 2016-11-02 MED ORDER — PROPOFOL 10 MG/ML IV BOLUS
INTRAVENOUS | Status: DC | PRN
  Administered 2016-11-02: 100 mg via INTRAVENOUS

## 2016-11-02 MED ORDER — LIDOCAINE-EPINEPHRINE 1 %-1:100000 IJ SOLN
INTRAMUSCULAR | Status: AC
  Filled 2016-11-02: qty 1

## 2016-11-02 MED ORDER — SUGAMMADEX SODIUM 500 MG/5ML IV SOLN
INTRAVENOUS | Status: DC | PRN
  Administered 2016-11-02: 300 mg via INTRAVENOUS

## 2016-11-02 MED ORDER — ROCURONIUM BROMIDE 100 MG/10ML IV SOLN
INTRAVENOUS | Status: DC | PRN
  Administered 2016-11-02: 30 mg via INTRAVENOUS
  Administered 2016-11-02 (×2): 10 mg via INTRAVENOUS

## 2016-11-02 MED ORDER — OXYMETAZOLINE HCL 0.05 % NA SOLN
NASAL | Status: AC
  Filled 2016-11-02: qty 15

## 2016-11-02 MED ORDER — PHENYLEPHRINE 40 MCG/ML (10ML) SYRINGE FOR IV PUSH (FOR BLOOD PRESSURE SUPPORT)
PREFILLED_SYRINGE | INTRAVENOUS | Status: AC
  Filled 2016-11-02: qty 10

## 2016-11-02 MED ORDER — BACITRACIN ZINC 500 UNIT/GM EX OINT
TOPICAL_OINTMENT | CUTANEOUS | Status: DC | PRN
  Administered 2016-11-02: 1 via TOPICAL

## 2016-11-02 MED ORDER — LACTATED RINGERS IV SOLN
INTRAVENOUS | Status: DC | PRN
  Administered 2016-11-02 (×2): via INTRAVENOUS

## 2016-11-02 MED ORDER — LIDOCAINE HCL (CARDIAC) 20 MG/ML IV SOLN
INTRAVENOUS | Status: DC | PRN
  Administered 2016-11-02: 80 mg via INTRAVENOUS

## 2016-11-02 MED ORDER — 0.9 % SODIUM CHLORIDE (POUR BTL) OPTIME
TOPICAL | Status: DC | PRN
  Administered 2016-11-02: 1000 mL

## 2016-11-02 MED ORDER — OXYMETAZOLINE HCL 0.05 % NA SOLN
NASAL | Status: DC | PRN
  Administered 2016-11-02: 1

## 2016-11-02 MED ORDER — DEXTROSE 50 % IV SOLN
INTRAVENOUS | Status: DC
Start: 2016-11-02 — End: 2016-11-02
  Filled 2016-11-02: qty 50

## 2016-11-02 MED ORDER — ONDANSETRON HCL 4 MG/2ML IJ SOLN
INTRAMUSCULAR | Status: AC
  Filled 2016-11-02: qty 2

## 2016-11-02 MED ORDER — LIDOCAINE-EPINEPHRINE 1 %-1:100000 IJ SOLN
INTRAMUSCULAR | Status: DC | PRN
  Administered 2016-11-02: 4.5 mL

## 2016-11-02 MED ORDER — HYDROMORPHONE HCL 1 MG/ML IJ SOLN
0.2500 mg | INTRAMUSCULAR | Status: DC | PRN
  Administered 2016-11-02: 0.5 mg via INTRAVENOUS

## 2016-11-02 MED ORDER — ROCURONIUM BROMIDE 50 MG/5ML IV SOLN
INTRAVENOUS | Status: AC
  Filled 2016-11-02: qty 2

## 2016-11-02 MED ORDER — CEFAZOLIN SODIUM-DEXTROSE 2-3 GM-% IV SOLR
INTRAVENOUS | Status: DC | PRN
  Administered 2016-11-02: 2 g via INTRAVENOUS

## 2016-11-02 MED ORDER — ONDANSETRON HCL 4 MG/2ML IJ SOLN
INTRAMUSCULAR | Status: DC | PRN
  Administered 2016-11-02: 4 mg via INTRAVENOUS

## 2016-11-02 MED ORDER — PROPOFOL 10 MG/ML IV BOLUS
INTRAVENOUS | Status: AC
  Filled 2016-11-02: qty 20

## 2016-11-02 MED ORDER — SUCCINYLCHOLINE CHLORIDE 20 MG/ML IJ SOLN
INTRAMUSCULAR | Status: DC | PRN
  Administered 2016-11-02: 100 mg via INTRAVENOUS

## 2016-11-02 MED ORDER — HYDROMORPHONE HCL 1 MG/ML IJ SOLN
INTRAMUSCULAR | Status: AC
  Filled 2016-11-02: qty 0.5

## 2016-11-02 MED ORDER — SUCCINYLCHOLINE CHLORIDE 200 MG/10ML IV SOSY
PREFILLED_SYRINGE | INTRAVENOUS | Status: AC
  Filled 2016-11-02: qty 10

## 2016-11-02 MED ORDER — DEXAMETHASONE SODIUM PHOSPHATE 10 MG/ML IJ SOLN
INTRAMUSCULAR | Status: DC | PRN
  Administered 2016-11-02: 5 mg via INTRAVENOUS

## 2016-11-02 MED ORDER — EPHEDRINE SULFATE 50 MG/ML IJ SOLN
INTRAMUSCULAR | Status: DC | PRN
  Administered 2016-11-02: 10 mg via INTRAVENOUS

## 2016-11-02 MED ORDER — LIDOCAINE HCL (CARDIAC) 20 MG/ML IV SOLN
INTRAVENOUS | Status: AC
Start: 2016-11-02 — End: 2016-11-02
  Filled 2016-11-02: qty 5

## 2016-11-02 MED ORDER — ROCURONIUM BROMIDE 50 MG/5ML IV SOLN
INTRAVENOUS | Status: AC
  Filled 2016-11-02: qty 1

## 2016-11-02 MED ORDER — DEXTROSE 50 % IV SOLN
25.0000 mL | Freq: Once | INTRAVENOUS | Status: AC
  Administered 2016-11-02: 25 mL via INTRAVENOUS

## 2016-11-02 MED ORDER — SUGAMMADEX SODIUM 500 MG/5ML IV SOLN
INTRAVENOUS | Status: AC
  Filled 2016-11-02: qty 5

## 2016-11-02 MED ORDER — AMOXICILLIN 875 MG PO TABS: 875.0000 mg | ORAL_TABLET | Freq: Two times a day (BID) | ORAL | 0 refills | Status: AC

## 2016-11-02 MED ORDER — EPHEDRINE 5 MG/ML INJ
INTRAVENOUS | Status: AC
  Filled 2016-11-02: qty 20

## 2016-11-02 MED ORDER — GLYCOPYRROLATE 0.2 MG/ML IJ SOLN
INTRAMUSCULAR | Status: DC | PRN
  Administered 2016-11-02 (×2): 0.1 mg via INTRAVENOUS

## 2016-11-02 SURGICAL SUPPLY — 30 items
CANISTER SUCT 3000ML PPV (MISCELLANEOUS) ×3 IMPLANT
COAGULATOR SUCT SWTCH 10FR 6 (ELECTROSURGICAL) ×3 IMPLANT
CONT SPEC 4OZ CLIKSEAL STRL BL (MISCELLANEOUS) ×3 IMPLANT
COVER SURGICAL LIGHT HANDLE (MISCELLANEOUS) ×3 IMPLANT
DRAPE HALF SHEET 40X57 (DRAPES) IMPLANT
ELECT REM PT RETURN 9FT ADLT (ELECTROSURGICAL) ×3
ELECTRODE REM PT RTRN 9FT ADLT (ELECTROSURGICAL) ×2 IMPLANT
GAUZE SPONGE 2X2 8PLY STRL LF (GAUZE/BANDAGES/DRESSINGS) ×2 IMPLANT
GLOVE BIOGEL PI IND STRL 8.5 (GLOVE) ×2 IMPLANT
GLOVE BIOGEL PI INDICATOR 8.5 (GLOVE) ×1
GLOVE ECLIPSE 7.5 STRL STRAW (GLOVE) ×3 IMPLANT
GLOVE ECLIPSE 8.0 STRL XLNG CF (GLOVE) ×3 IMPLANT
GOWN STRL REUS W/ TWL LRG LVL3 (GOWN DISPOSABLE) ×2 IMPLANT
GOWN STRL REUS W/ TWL XL LVL3 (GOWN DISPOSABLE) ×2 IMPLANT
GOWN STRL REUS W/TWL LRG LVL3 (GOWN DISPOSABLE) ×1
GOWN STRL REUS W/TWL XL LVL3 (GOWN DISPOSABLE) ×1
KIT BASIN OR (CUSTOM PROCEDURE TRAY) ×3 IMPLANT
KIT ROOM TURNOVER OR (KITS) ×3 IMPLANT
NEEDLE HYPO 25GX1X1/2 BEV (NEEDLE) ×3 IMPLANT
NS IRRIG 1000ML POUR BTL (IV SOLUTION) ×3 IMPLANT
PAD ARMBOARD 7.5X6 YLW CONV (MISCELLANEOUS) ×3 IMPLANT
SPLINT NASAL DOYLE BI-VL (GAUZE/BANDAGES/DRESSINGS) ×3 IMPLANT
SPONGE GAUZE 2X2 STER 10/PKG (GAUZE/BANDAGES/DRESSINGS) ×1
SPONGE NEURO XRAY DETECT 1X3 (DISPOSABLE) ×3 IMPLANT
SUT CHROMIC 3 0 SH 27 (SUTURE) ×3 IMPLANT
SUT PLAIN 4 0 ~~LOC~~ 1 (SUTURE) ×3 IMPLANT
SUT PROLENE 2 0 FS (SUTURE) ×3 IMPLANT
TRAY ENT MC OR (CUSTOM PROCEDURE TRAY) ×3 IMPLANT
TUBE SALEM SUMP 16 FR W/ARV (TUBING) IMPLANT
TUBING EXTENTION W/L.L. (IV SETS) IMPLANT

## 2016-11-02 NOTE — H&P (Signed)
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  On his recent sinus CT scan, the patient was noted to have a soft tissue density within his left nasopharynx. At his last visit, a soft tissue mass was noted within the left fossa of Rosenmller.  No surface ulceration was noted.  The appearance was thought most likely to be consistent with a benign cyst.  The patient returns today reporting persistent nasal obstruction. He is interested in surgical intervention. He was recently cleared by his cardiologist at [facility]. The patient has a history of two heart attacks and coronary bypass graft two years ago. He is a Sales promotion account executive witness.   Exam: The nasal cavities were decongested and anesthetised with a combination of oxymetazoline and 4% lidocaine solution.  The flexible scope was inserted into the right nasal cavity.  Endoscopy of the inferior and middle meatus was performed.  Edematous mucosa was noted. Severe nasal septal deviation. No polyp, mass, or lesion was appreciated.  Olfactory cleft was clear.  A soft tissue mass was noted within the left fossa of Rosenmller.  No surface ulceration was noted.  Turbinates were hypertrophied but without mass.  Incomplete response to decongestion.  The procedure was repeated on the contralateral side with similar findings.  The patient tolerated the procedure well.  Instructions were given to avoid eating or drinking for 2 hours.    Assessment: 1.  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  2.  A soft tissue mass is also noted within the left fossa of Rosenmller.  Plan: 1.  The nasal endoscopy findings are reviewed with the patient and his wife.  2.  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   3.  The risks, benefits, alternatives and details of the procedures are reviewed.  Questions are invited and answered.

## 2016-11-02 NOTE — Addendum Note (Signed)
Addendum  created 11/02/16 1137 by Inda Coke, CRNA   Anesthesia Intra Flowsheets edited

## 2016-11-02 NOTE — Progress Notes (Signed)
Hypoglycemic Event  CBG: 66   Treatment: D50 IV 25 mL  Symptoms: None  Follow-up CBG: Time:1032  CBG Result:135   Possible Reasons for Event: Other: npo for surgery  Comments/MD notified: Dr Jefm Petty notified of CBG of 66. Ordered 17ml of Dextrose ampule. Will recheck at 1030.    Rikki Smestad P

## 2016-11-02 NOTE — Discharge Instructions (Signed)

## 2016-11-02 NOTE — Anesthesia Postprocedure Evaluation (Signed)
Anesthesia Post Note  Patient: Alexander Stafford  Procedure(s) Performed: Procedure(s) (LRB): NASAL SEPTOPLASTY WITH BILATERAL TURBINATE REDUCTION (Bilateral) NASOPHARYNGEAL BIOPSY (N/A)     Patient location during evaluation: PACU Anesthesia Type: General Level of consciousness: awake and alert Pain management: pain level controlled Vital Signs Assessment: post-procedure vital signs reviewed and stable Respiratory status: spontaneous breathing, nonlabored ventilation and respiratory function stable Cardiovascular status: blood pressure returned to baseline and stable Postop Assessment: no signs of nausea or vomiting Anesthetic complications: no    Last Vitals:  Vitals:   11/02/16 1117 11/02/16 1129  BP: (!) 184/96 (!) 163/90  Pulse: 72 68  Resp: 14   Temp:      Last Pain:  Vitals:   11/02/16 1129  TempSrc:   PainSc: 3                  Aldine Chakraborty,W. EDMOND

## 2016-11-02 NOTE — Op Note (Signed)
DATE OF PROCEDURE: 11/02/2016  OPERATIVE REPORT   SURGEON: Leta Baptist, MD   PREOPERATIVE DIAGNOSES:  1. Severe nasal septal deviation.  2. Bilateral inferior turbinate hypertrophy.  3. Chronic nasal obstruction. 4. Left nasopharyngeal mass.  POSTOPERATIVE DIAGNOSES:  1. Severe nasal septal deviation.  2. Bilateral inferior turbinate hypertrophy.  3. Chronic nasal obstruction. 4. Left nasopharyngeal mass.  PROCEDURE PERFORMED:  1. Septoplasty.  2. Bilateral partial inferior turbinate resection.  3. Nasopharyngeal biopsy  ANESTHESIA: General endotracheal tube anesthesia.   COMPLICATIONS: None.   ESTIMATED BLOOD LOSS: 50 mL.   INDICATION FOR PROCEDURE: Alexander Stafford is a 76 y.o. male with a history of chronic nasal obstruction. The patient was  treated with antihistamine, decongestant, and steroid nasal spray. However, the patient continues to be symptomatic. On examination, the patient was noted to have bilateral severe inferior turbinate hypertrophy and significant nasal septal deviation, causing significant nasal obstruction. On his recent CT scan, the patient was noted to have a soft tissue density within his left nasopharynx. The appearance of the soft tissue lesion was most consistent with a benign cyst. Based on the above findings, the decision was made for the patient to undergo the above-stated procedures. The risks, benefits, alternatives, and details of the procedures were discussed with the patient. Questions were invited and answered. Informed consent was obtained.   DESCRIPTION OF PROCEDURE: The patient was taken to the operating room and placed supine on the operating table. General endotracheal tube anesthesia was administered by the anesthesiologist. The patient was positioned, and prepped and draped in the standard fashion for nasal surgery. Pledgets soaked with Afrin were placed in both nasal cavities for decongestion. The pledgets were subsequently removed. The above  mentioned severe septal deviation was again noted. 1% lidocaine with 1:100,000 epinephrine was injected onto the nasal septum bilaterally. A hemitransfixion incision was made on the left side. The mucosal flap was noted to be significantly scarred. As a result, only a portion of the septal mucosa was elevated. The deviated portion of the cartilaginous and bony septum weree removed in piecemeal fashion.The septum was then quilted with 4-0 plain gut sutures. The hemitransfixion incision was closed with interrupted 4-0 chromic sutures.   Using a 0 scope, the nasopharynx was examined. A cystic lesion was noted within the left nasopharynx. The lesion was biopsied with a straight cup forceps. The specimens were sent to the pathology department for permanent histologic identification.  Attention was then focused on the inferior turbinates. The inferior one half of both hypertrophied inferior turbinate was crossclamped with a Kelly clamp. The inferior one half of each inferior turbinate was then resected with a pair of cross cutting scissors. Hemostasis was achieved with a suction cautery device.   The care of the patient was turned over to the anesthesiologist. The patient was awakened from anesthesia without difficulty. The patient was extubated and transferred to the recovery room in good condition.   OPERATIVE FINDINGS: Severe nasal septal deviation and bilateral inferior turbinate hypertrophy. A cystic lesion was noted within the left nasopharynx.  SPECIMEN: Nasopharyngeal biopsy specimens.  FOLLOWUP CARE: The patient be discharged home once he is awake and alert.  The patient will follow up in my office in approximately 1 week for splint removal.   Stetson Pelaez Raynelle Bring, MD

## 2016-11-02 NOTE — Transfer of Care (Signed)
Immediate Anesthesia Transfer of Care Note  Patient: Alexander Stafford  Procedure(s) Performed: Procedure(s): NASAL SEPTOPLASTY WITH BILATERAL TURBINATE REDUCTION (Bilateral) NASOPHARYNGEAL BIOPSY (N/A)  Patient Location: PACU  Anesthesia Type:General  Level of Consciousness: awake and alert   Airway & Oxygen Therapy: Patient Spontanous Breathing and Patient connected to face mask oxygen  Post-op Assessment: Report given to RN, Post -op Vital signs reviewed and stable and Patient moving all extremities X 4  Post vital signs: Reviewed and stable  Last Vitals:  Vitals:   11/02/16 0642 11/02/16 1000  BP:  (!) 146/84  Pulse: 60 76  Resp:  14  Temp:  36.4 C    Last Pain:  Vitals:   11/02/16 0632  TempSrc: Oral  PainSc: 3       Patients Stated Pain Goal: 3 ([id number])  Complications: No apparent anesthesia complications

## 2016-11-02 NOTE — Anesthesia Procedure Notes (Signed)
Procedure Name: Intubation Date/Time: 11/02/2016 8:36 AM Performed by: Rejeana Brock L Pre-anesthesia Checklist: Patient identified, Emergency Drugs available, Suction available and Patient being monitored Patient Re-evaluated:Patient Re-evaluated prior to induction Oxygen Delivery Method: Circle System Utilized Preoxygenation: Pre-oxygenation with 100% oxygen Induction Type: IV induction Ventilation: Mask ventilation without difficulty Laryngoscope Size: Mac and 4 Grade View: Grade II Tube type: Oral Rae Tube size: 7.5 mm Number of attempts: 1 Airway Equipment and Method: Stylet and Oral airway Placement Confirmation: ETT inserted through vocal cords under direct vision,  positive ETCO2 and breath sounds checked- equal and bilateral Secured at: 21 cm Tube secured with: Tape Dental Injury: Teeth and Oropharynx as per pre-operative assessment

## 2016-11-03 ENCOUNTER — Encounter (HOSPITAL_COMMUNITY): Payer: Self-pay | Admitting: Otolaryngology

## 2016-11-07 ENCOUNTER — Ambulatory Visit (INDEPENDENT_AMBULATORY_CARE_PROVIDER_SITE_OTHER): Payer: Medicare Other | Admitting: Otolaryngology

## 2017-06-14 ENCOUNTER — Other Ambulatory Visit (INDEPENDENT_AMBULATORY_CARE_PROVIDER_SITE_OTHER): Payer: Self-pay | Admitting: Otolaryngology

## 2017-06-14 DIAGNOSIS — H9190 Unspecified hearing loss, unspecified ear: Secondary | ICD-10-CM

## 2017-06-26 ENCOUNTER — Ambulatory Visit
Admission: RE | Admit: 2017-06-26 | Discharge: 2017-06-26 | Disposition: A | Discharge status: Home / Self Care | Payer: Medicare Other | Source: Ambulatory Visit | Attending: Otolaryngology | Admitting: Otolaryngology

## 2017-06-26 DIAGNOSIS — H9190 Unspecified hearing loss, unspecified ear: Secondary | ICD-10-CM

## 2019-03-28 IMAGING — CT CT TEMPORAL BONES W/O CM
1 series · 15 of 28 positions shown, 19 images · non-contrast
Comparison: None.

CLINICAL DATA: Diminished hearing in the left ear that is worse for
the past 3 months. History of adenoidectomy 2 years ago.

EXAM:
CT TEMPORAL BONES WITHOUT CONTRAST
TECHNIQUE: Axial and coronal plane CT imaging of the petrous temporal bones was
performed with thin-collimation image reconstruction. No intravenous
contrast was administered. Multiplanar CT image reconstructions were
also generated.

[Series 5: brain · axial · 0.46mm/px · z∈[-152,-102]mm · 15 of 28 slices shown, 19 images]
[im 2/28  brain]
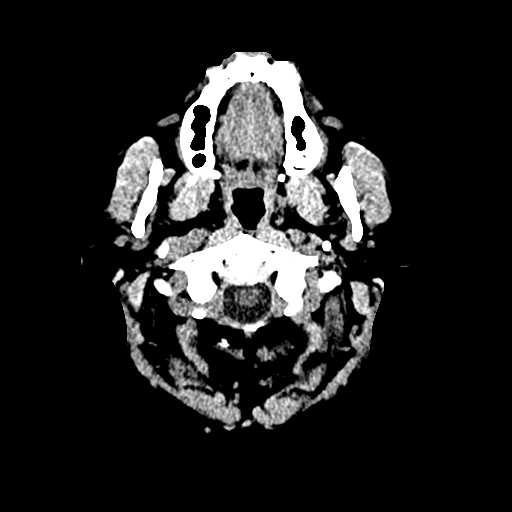
[im 2/28  bone]
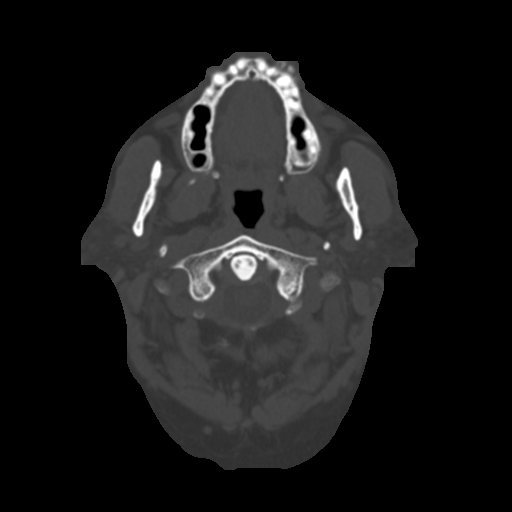
[im 4/28  bone]
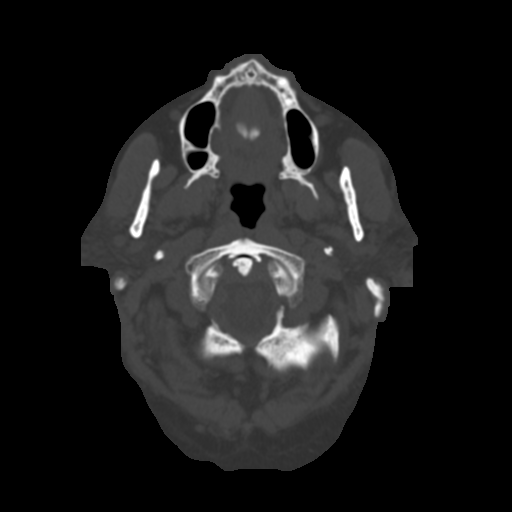
[im 6/28  bone]
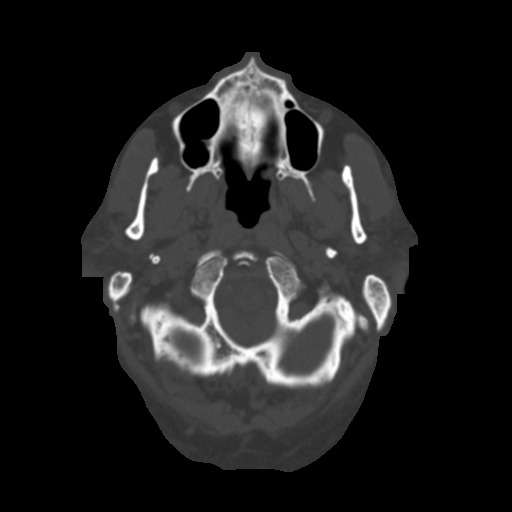
[im 8/28  bone]
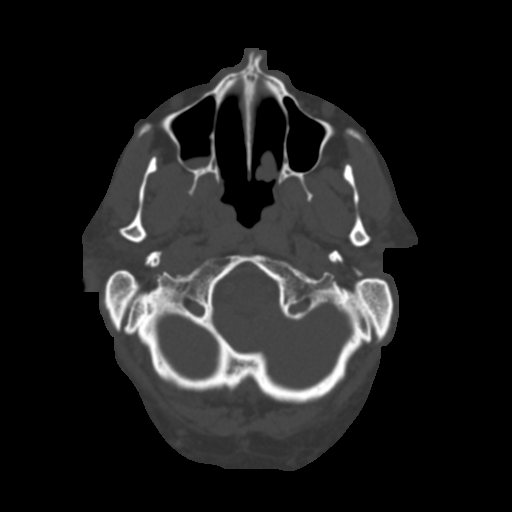
[im 9/28  brain]
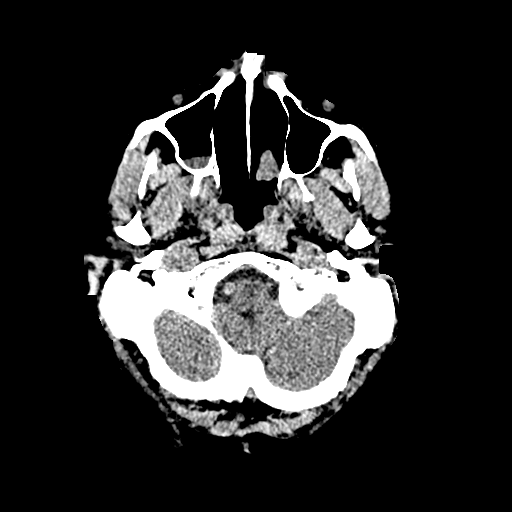
[im 9/28  bone]
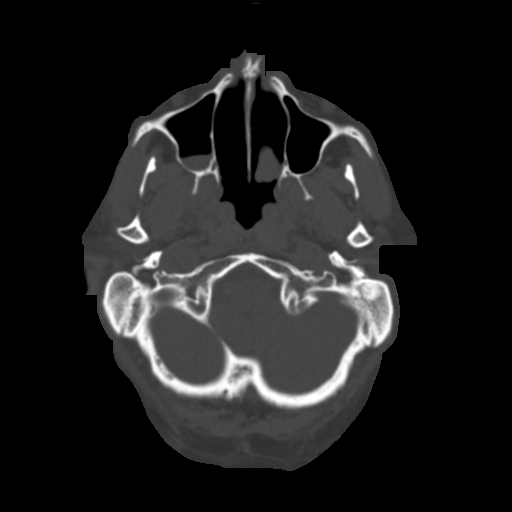
[im 11/28  bone]
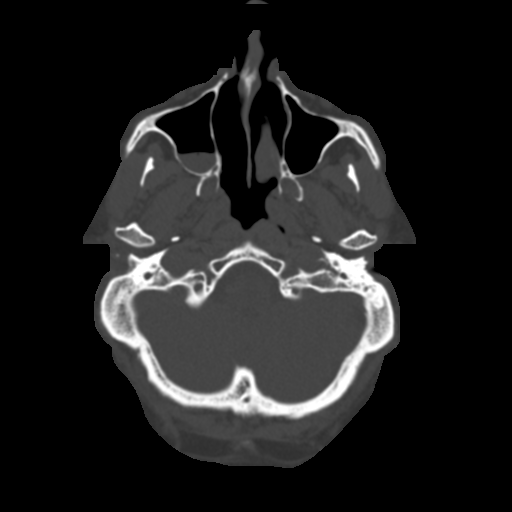
[im 13/28  bone]
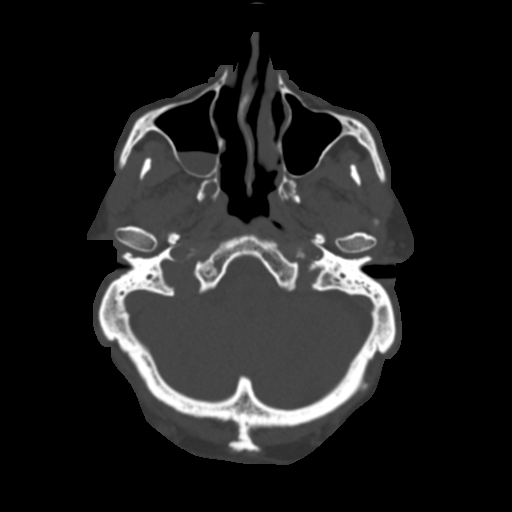
[im 15/28  bone]
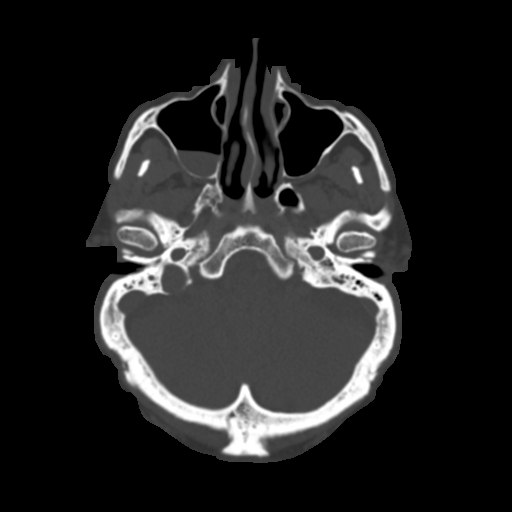
[im 16/28  brain]
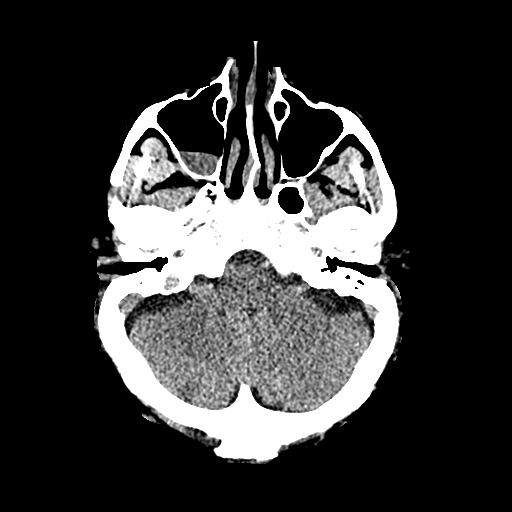
[im 16/28  bone]
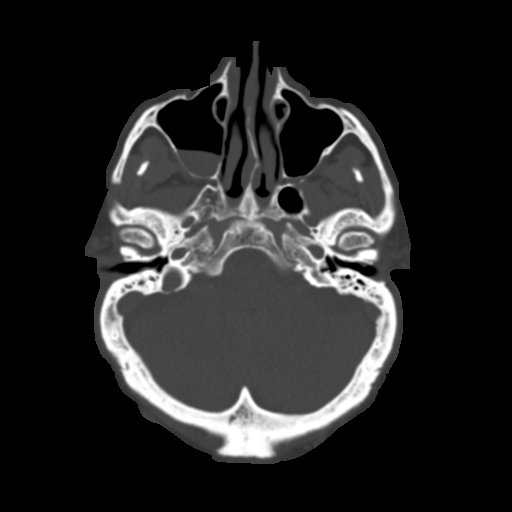
[im 18/28  bone]
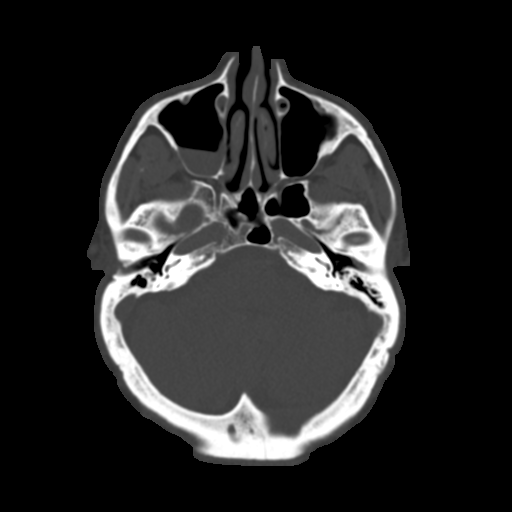
[im 20/28  bone]
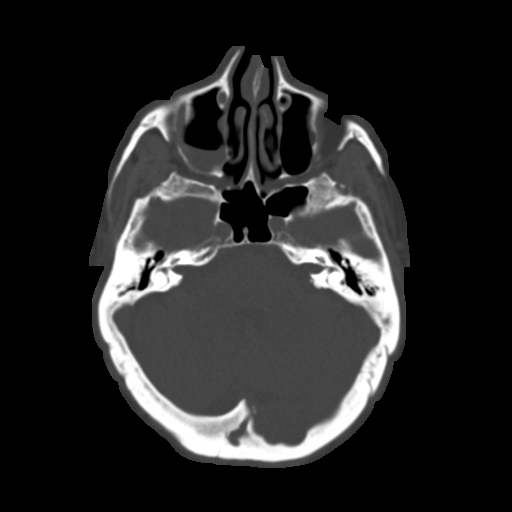
[im 21/28  bone]
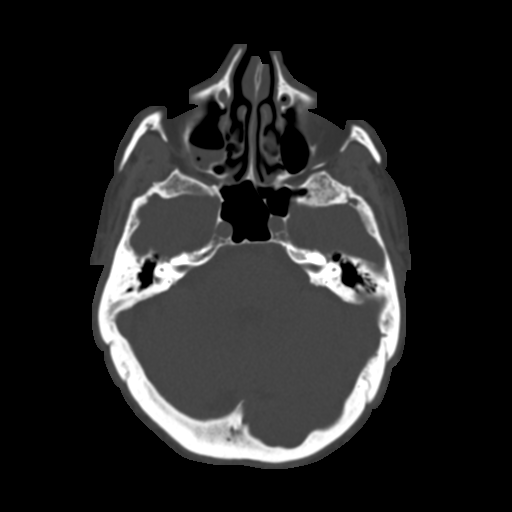
[im 23/28  brain]
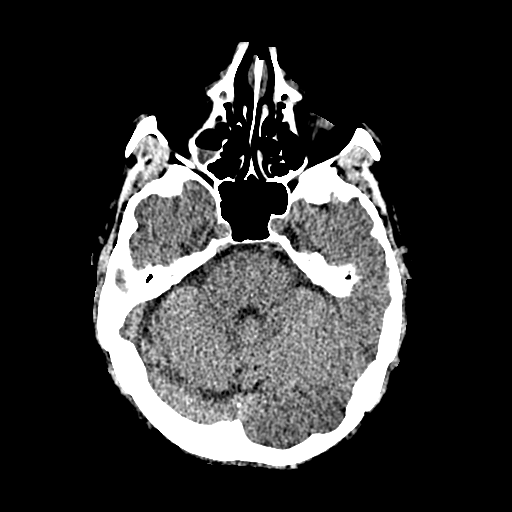
[im 23/28  bone]
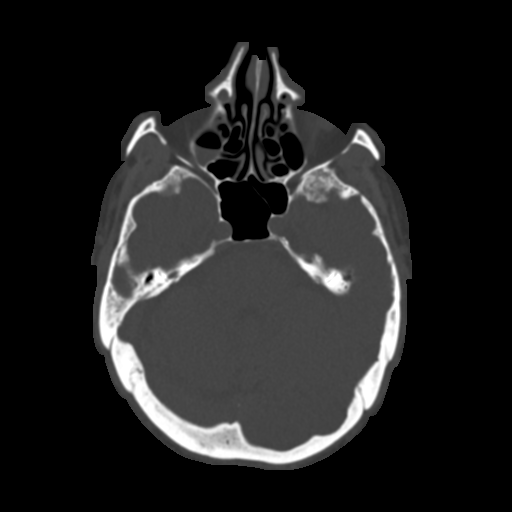
[im 25/28  bone]
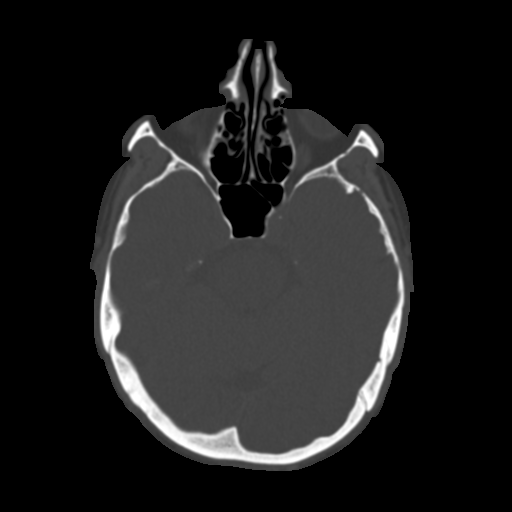
[im 27/28  bone]
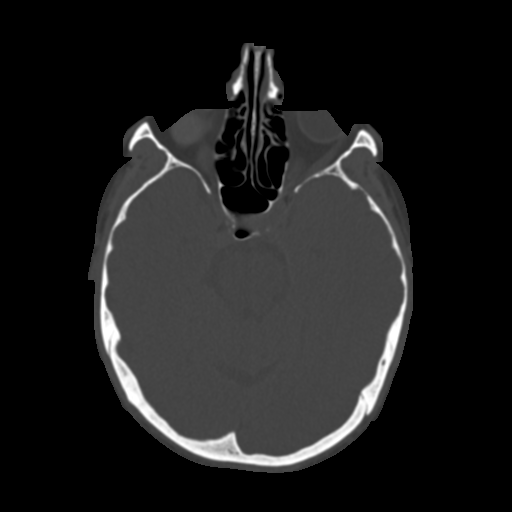

[15 of 28 positions shown; findings below may reference images not displayed]

FINDINGS: Right temporal bone: Moderate material in the bony external auditory
canal consistent with cerumen. No erosive changes. Smooth but mildly
appearance of the tympanic membrane which could be cerumen or
intrinsic thickening. The ossicles are normally formed and aligned.
Under pneumatized right mastoid that is partially opacified. The
middle ear is well aerated. No erosive changes. The labyrinthine
structures appear normally formed. The arcuate eminence is
imperceptibly thin or dehiscent along a probable venous channel and
arachnoid granulation following the right petrous ridge. Internal
auditory canal is normal in size. The vestibular aqueduct is normal
in size. The carotid and sigmoid sinus are bone covered.
Unremarkable facial nerve canal.

Left temporal bone: The [place] and external auditory canal are
unremarkable. Mild smooth thickening of the tympanic membrane with
apparent defect posteriorly. The ossicles are normally formed and
aligned. Mastoid and middle ear is well pneumatized. Inferiorly
minimal mastoid opacification. The labyrinthine structures appear
normally formed and bony covered. Internal auditory canal is normal
in size. The vestibular aqueduct is normal in size. The carotid and
sigmoid sinus are bone covered. Unremarkable facial nerve canal.

Right enucleation with prosthesis.  Right enophthalmos.

Layering secretions in the right maxillary sinus. Right inferior
turbinectomy.
IMPRESSION: 1. Under pneumatized right mastoid with mild bilateral mastoid
opacification. No erosive changes for cholesteatoma.
2. Possible perforation of the posterior left tympanic membrane.
3. Imperceptible arcuate eminence on the right.
4. Right more than left cerumen.
5. Secretions layer in the right maxillary sinus.
